# Patient Record
Sex: Female | Born: 1960 | Race: Black or African American | Hispanic: No | Marital: Married | Smoking: Never smoker
Health system: Southern US, Community
[De-identification: ages and names within clinical notes are randomized; demographics above are authoritative.]

## PROBLEM LIST (undated history)

## (undated) DIAGNOSIS — E785 Hyperlipidemia, unspecified: Secondary | ICD-10-CM

## (undated) DIAGNOSIS — E7800 Pure hypercholesterolemia, unspecified: Secondary | ICD-10-CM

## (undated) DIAGNOSIS — F419 Anxiety disorder, unspecified: Secondary | ICD-10-CM

## (undated) DIAGNOSIS — I10 Essential (primary) hypertension: Secondary | ICD-10-CM

## (undated) HISTORY — DX: Hyperlipidemia, unspecified: E78.5

## (undated) HISTORY — DX: Essential (primary) hypertension: I10

---

## 2015-05-09 LAB — TSH: TSH: 1.9 u[IU]/mL (ref <=5.90)

## 2016-05-22 ENCOUNTER — Encounter: Payer: Self-pay | Admitting: "Endocrinology

## 2016-05-22 ENCOUNTER — Ambulatory Visit (INDEPENDENT_AMBULATORY_CARE_PROVIDER_SITE_OTHER): Payer: BLUE CROSS/BLUE SHIELD | Admitting: "Endocrinology

## 2016-05-22 VITALS — BP 111/78 | HR 61 | Ht 64.0 in | Wt 171.0 lb

## 2016-05-22 DIAGNOSIS — E049 Nontoxic goiter, unspecified: Secondary | ICD-10-CM | POA: Insufficient documentation

## 2016-05-22 NOTE — Progress Notes (Signed)
Subjective:    Patient ID: Shelia Schaefer, female    DOB: [birth year],  DAVIS,STEPHEN, MD   Past Medical History:  Diagnosis Date  . Hyperlipidemia   . Hypertension    No past surgical history on file. Social History   Social History  . Marital status: Married    Spouse name: N/A  . Number of children: N/A  . Years of education: N/A   Social History Main Topics  . Smoking status: Never Smoker  . Smokeless tobacco: Never Used  . Alcohol use No  . Drug use: No  . Sexual activity: Not Asked   Other Topics Concern  . None   Social History Narrative  . None   Outpatient Encounter Prescriptions as of 05/22/2016  Medication Sig  . ALPRAZolam (XANAX) 1 MG tablet Take 1 mg by mouth at bedtime as needed for anxiety.  . hydrochlorothiazide (HYDRODIURIL) 12.5 MG tablet Take 12.5 mg by mouth daily.  . Melatonin 10 MG TABS Take by mouth at bedtime.  . simvastatin (ZOCOR) 20 MG tablet Take 20 mg by mouth daily.   No facility-administered encounter medications on file as of 05/22/2016.    ALLERGIES: Allergies  Allergen Reactions  . Iodine    VACCINATION STATUS:  There is no immunization history on file for this patient.  HPI  56 year old female patient with medical history as above. She is being seen in consultation for nodular goiter requested by Dr. Earlene Plater. She was found to have 1.9 cm left thyroid lobe nodule on 04/09/2016 thyroid sonograms. Heart thyroid function tests from 04/06/2016 was within normal limits including TSH 1.9 and free T4 1.06. She denies family history of thyroid dysfunction. She denies dysphagia, shortness of breath, nor voice change. - She denies neck exposure to radiation. She complains of poor concentration, fatigue, sleep disturbance. -She is not on any antithyroid medications.  Review of Systems Constitutional: no weight gain/loss, no fatigue, no subjective hyperthermia/hypothermia Eyes: no blurry vision, no xerophthalmia ENT: no sore throat, no  nodules palpated in throat, no dysphagia/odynophagia, no hoarseness Cardiovascular: no CP/SOB/palpitations/leg swelling Respiratory: no cough/SOB Gastrointestinal: no N/V/D/C Musculoskeletal: no muscle/joint aches Skin: no rashes Neurological: no tremors/numbness/tingling/dizziness Psychiatric: no depression, +anxiety  Objective:    BP 111/78   Pulse 61   Ht 5\' 4"  (1.626 m)   Wt 171 lb (77.6 kg)   BMI 29.35 kg/m   Wt Readings from Last 3 Encounters:  05/22/16 171 lb (77.6 kg)    Physical Exam Constitutional: overweight, in NAD Eyes: PERRLA, EOMI, no exophthalmos ENT: moist mucous membranes, + palpable thyroid, no cervical lymphadenopathy Cardiovascular: RRR, No MRG Respiratory: CTA B Gastrointestinal: abdomen soft, NT, ND, BS+ Musculoskeletal: no deformities, strength intact in all 4 Skin: moist, warm, no rashes Neurological: no tremor with outstretched hands, DTR normal in all 4  Thyroid function tests from 04/06/2016 showed TSH 1.9 and free T4 1.06. Review or thyroid ultrasound from 04/09/2016 showed 1.9 cm solitary nodule on the left lobe thyroid.   Assessment & Plan:   1. Nodular goiter - I have reviewed her available thyroid workup records. She has euthyroid nodular goiter. Based on the size of the nodule at 1.9 centimeters on the left lobe, she will need fine needle aspiration under ultrasound guidance. - This procedure will be scheduled for her  at [facility]. She will return in 2 weeks with biopsy results. - If her biopsy results are suspicious or malignant she will be considered for thyroidectomy. -  Her thyroid function  tests are within normal limits, she will not need any intervention with thyroid hormone at this time. - I have advised her to pick up 30-60 minutes of exercise daily to help her with her fatigue, sleep disturbance, concentration.  - I advised patient to maintain close follow up with DAVIS,STEPHEN, MD for primary care needs. Follow up  plan: Return in about 2 weeks (around 06/05/2016) for biopsy results.  Marquis LunchGebre Mariah Gerstenberger, MD Phone: [phone]  Fax: [phone]   05/22/2016, 3:14 PM

## 2016-05-24 ENCOUNTER — Telehealth: Payer: Self-pay | Admitting: "Endocrinology

## 2016-05-24 NOTE — Telephone Encounter (Signed)
Patient LMOM stating she was supposed to get a fax from the nurse that showed her appointment date for her upcoming biopsy and she has not received it as of 4:00. Please call or fax the note. She has to have it to request off from work.

## 2016-05-25 NOTE — Telephone Encounter (Signed)
Faxed at lunch. Must not have gone through. Receptionist could not fax appt. Faxed a new letter this a.m.

## 2016-05-30 ENCOUNTER — Ambulatory Visit (HOSPITAL_COMMUNITY)
Admission: RE | Admit: 2016-05-30 | Discharge: 2016-05-30 | Disposition: A | Discharge status: Home / Self Care | Payer: BLUE CROSS/BLUE SHIELD | Source: Ambulatory Visit | Attending: "Endocrinology | Admitting: "Endocrinology

## 2016-05-30 DIAGNOSIS — E049 Nontoxic goiter, unspecified: Secondary | ICD-10-CM

## 2016-05-30 NOTE — Discharge Instructions (Signed)
Thyroid Biopsy °The thyroid gland is a butterfly-shaped gland located in the front of the neck. It produces hormones that affect metabolism, growth and development, and body temperature. Thyroid biopsy is a procedure in which small samples of tissue or fluid are removed from the thyroid gland. The samples are then looked at under a microscope to check for abnormalities. This procedure is done to determine the cause of thyroid problems. It may be done to check for infection, cancer, or other thyroid problems. °Two methods may be used for a thyroid biopsy. In one method, a thin needle is inserted through the skin and into the thyroid gland. In the other method, an open incision is made through the skin. °Tell a health care provider about: °· Any allergies you have. °· All medicines you are taking, including vitamins, herbs, eye drops, creams, and over-the-counter medicines. °· Any problems you or family members have had with anesthetic medicines. °· Any blood disorders you have. °· Any surgeries you have had. °· Any medical conditions you have. °What are the risks? °Generally, this is a safe procedure. However, problems can occur and include: °· Bleeding from the procedure site. °· Infection. °· Injury to structures near the thyroid gland. °What happens before the procedure? °· Ask your health care provider about: °¨ Changing or stopping your regular medicines. This is especially important if you are taking diabetes medicines or blood thinners. °¨ Taking medicines such as aspirin and ibuprofen. These medicines can thin your blood. Do not take these medicines before your procedure if your health care provider asks you not to. °· Do not eat or drink anything after midnight on the night before the procedure or as directed by your health care provider. °· You may have a blood sample taken. °What happens during the procedure? °Either of these methods may be used to perform a thyroid biopsy: °· Fine needle biopsy. You may  be given medicine to help you relax (sedative). You will be asked to lie on your back with your head tipped backward to extend your neck. An area on your neck will be cleaned. A needle will then be inserted through the skin of your neck. You may be asked to avoid coughing, talking, swallowing, or making sounds during some portions of the procedure. The needle will be withdrawn once the tissue or fluid samples have been removed. Pressure may be applied to your neck to reduce swelling and ensure that bleeding has stopped. The samples will be sent to a lab for examination. °· Open biopsy. You will be given medicine to make you sleep (general anesthetic). An incision will be made in your neck. A sample of thyroid tissue will be removed using surgical tools. The tissue sample will be sent for examination. In some cases, the sample may be examined during the biopsy. If that is done and cancer cells are found, some or all of the thyroid gland may be removed. The incision will be closed with stitches. °What happens after the procedure? °· Your recovery will be assessed and monitored. °· You may have soreness and tenderness at the site of the biopsy. This should go away after a few days. °· If you had an open biopsy, you may have a hoarse voice or sore throat for a couple days. °· It is your responsibility to get your test results. °This information is not intended to replace advice given to you by your health care provider. Make sure you discuss any questions you have with your health   care provider. °Document Released: 02/11/2007 Document Revised: 12/18/2015 Document Reviewed: 07/09/2013 °Elsevier Interactive Patient Education © 2017 [employer] ° °

## 2016-06-06 ENCOUNTER — Ambulatory Visit (HOSPITAL_COMMUNITY)
Admission: RE | Admit: 2016-06-06 | Discharge: 2016-06-06 | Disposition: A | Discharge status: Home / Self Care | Payer: BLUE CROSS/BLUE SHIELD | Source: Ambulatory Visit | Attending: "Endocrinology | Admitting: "Endocrinology

## 2016-06-06 ENCOUNTER — Encounter (HOSPITAL_COMMUNITY): Payer: Self-pay

## 2016-06-06 DIAGNOSIS — E049 Nontoxic goiter, unspecified: Secondary | ICD-10-CM | POA: Insufficient documentation

## 2016-06-06 HISTORY — DX: Anxiety disorder, unspecified: F41.9

## 2016-06-06 HISTORY — DX: Pure hypercholesterolemia, unspecified: E78.00

## 2016-06-06 MED ORDER — LIDOCAINE HCL (PF) 1 % IJ SOLN
INTRAMUSCULAR | Status: AC
  Administered 2016-06-06: 5 mL
  Filled 2016-06-06: qty 5

## 2016-06-06 NOTE — Procedures (Signed)
PreOperative Dx: LEFT thyroid nodule Postoperative Dx: LEFT thyroid nodule Procedure:   US guided FNA of LEFT thyroid nodule Radiologist:  Kaylor Simenson Anesthesia:  2 ml of 2% lidocaine Specimen:  FNA x 3  EBL:   < 1 ml Complications: None 

## 2016-06-06 NOTE — Discharge Instructions (Signed)
Thyroid Biopsy °The thyroid gland is a butterfly-shaped gland located in the front of the neck. It produces hormones that affect metabolism, growth and development, and body temperature. Thyroid biopsy is a procedure in which small samples of tissue or fluid are removed from the thyroid gland. The samples are then looked at under a microscope to check for abnormalities. This procedure is done to determine the cause of thyroid problems. It may be done to check for infection, cancer, or other thyroid problems. °Two methods may be used for a thyroid biopsy. In one method, a thin needle is inserted through the skin and into the thyroid gland. In the other method, an open incision is made through the skin. °Tell a health care provider about: °· Any allergies you have. °· All medicines you are taking, including vitamins, herbs, eye drops, creams, and over-the-counter medicines. °· Any problems you or family members have had with anesthetic medicines. °· Any blood disorders you have. °· Any surgeries you have had. °· Any medical conditions you have. °What are the risks? °Generally, this is a safe procedure. However, problems can occur and include: °· Bleeding from the procedure site. °· Infection. °· Injury to structures near the thyroid gland. °What happens before the procedure? °· Ask your health care provider about: °¨ Changing or stopping your regular medicines. This is especially important if you are taking diabetes medicines or blood thinners. °¨ Taking medicines such as aspirin and ibuprofen. These medicines can thin your blood. Do not take these medicines before your procedure if your health care provider asks you not to. °· Do not eat or drink anything after midnight on the night before the procedure or as directed by your health care provider. °· You may have a blood sample taken. °What happens during the procedure? °Either of these methods may be used to perform a thyroid biopsy: °· Fine needle biopsy. You may  be given medicine to help you relax (sedative). You will be asked to lie on your back with your head tipped backward to extend your neck. An area on your neck will be cleaned. A needle will then be inserted through the skin of your neck. You may be asked to avoid coughing, talking, swallowing, or making sounds during some portions of the procedure. The needle will be withdrawn once the tissue or fluid samples have been removed. Pressure may be applied to your neck to reduce swelling and ensure that bleeding has stopped. The samples will be sent to a lab for examination. °· Open biopsy. You will be given medicine to make you sleep (general anesthetic). An incision will be made in your neck. A sample of thyroid tissue will be removed using surgical tools. The tissue sample will be sent for examination. In some cases, the sample may be examined during the biopsy. If that is done and cancer cells are found, some or all of the thyroid gland may be removed. The incision will be closed with stitches. °What happens after the procedure? °· Your recovery will be assessed and monitored. °· You may have soreness and tenderness at the site of the biopsy. This should go away after a few days. °· If you had an open biopsy, you may have a hoarse voice or sore throat for a couple days. °· It is your responsibility to get your test results. °This information is not intended to replace advice given to you by your health care provider. Make sure you discuss any questions you have with your health   care provider. °Document Released: 02/11/2007 Document Revised: 12/18/2015 Document Reviewed: 07/09/2013 °Elsevier Interactive Patient Education © 2017 [employer] ° °

## 2016-06-15 ENCOUNTER — Ambulatory Visit (INDEPENDENT_AMBULATORY_CARE_PROVIDER_SITE_OTHER): Payer: BLUE CROSS/BLUE SHIELD | Admitting: "Endocrinology

## 2016-06-15 ENCOUNTER — Encounter: Payer: Self-pay | Admitting: "Endocrinology

## 2016-06-15 ENCOUNTER — Other Ambulatory Visit: Payer: Self-pay | Admitting: "Endocrinology

## 2016-06-15 VITALS — BP 108/69 | HR 73 | Ht 64.0 in | Wt 171.0 lb

## 2016-06-15 DIAGNOSIS — E049 Nontoxic goiter, unspecified: Secondary | ICD-10-CM

## 2016-06-15 NOTE — Progress Notes (Signed)
Subjective:    Patient ID: Shelia Schaefer, female    DOB: 07/05/1960,  DAVIS,STEPHEN, MD   Past Medical History:  Diagnosis Date  . Anxiety   . Hypercholesterolemia   . Hyperlipidemia   . Hypertension    History reviewed. No pertinent surgical history. Social History   Social History  . Marital status: Married    Spouse name: N/A  . Number of children: N/A  . Years of education: N/A   Social History Main Topics  . Smoking status: Never Smoker  . Smokeless tobacco: Never Used  . Alcohol use No  . Drug use: No  . Sexual activity: Not Asked   Other Topics Concern  . None   Social History Narrative  . None   Outpatient Encounter Prescriptions as of 06/15/2016  Medication Sig  . ALPRAZolam (XANAX) 1 MG tablet Take 1 mg by mouth at bedtime as needed for anxiety.  . hydrochlorothiazide (HYDRODIURIL) 12.5 MG tablet Take 12.5 mg by mouth daily.  . Melatonin 10 MG TABS Take by mouth at bedtime.  . simvastatin (ZOCOR) 20 MG tablet Take 20 mg by mouth daily.   No facility-administered encounter medications on file as of 06/15/2016.    ALLERGIES: Allergies  Allergen Reactions  . Iodine    VACCINATION STATUS:  There is no immunization history on file for this patient.  HPI  56 year old female patient with medical history as above. She is being seen in f/u for nodular goiter requested by Dr. Earlene Plateravis. She was found to have 1.9 cm left thyroid lobe nodule on 04/09/2016 thyroid sonograms. She was sent to have FNA of this nodule which turns out to be benign. Her thyroid function tests from 04/06/2016 was within normal limits including TSH 1.9 and free T4 1.06. She denies family history of thyroid dysfunction. She denies dysphagia, shortness of breath, nor voice change. - She denies neck exposure to radiation. She complains of poor concentration, fatigue, sleep disturbance. -She is not on any antithyroid medications.  Review of Systems Constitutional: no weight gain/loss, no  fatigue, no subjective hyperthermia/hypothermia Eyes: no blurry vision, no xerophthalmia ENT: no sore throat, no nodules palpated in throat, no dysphagia/odynophagia, no hoarseness Cardiovascular: no CP/SOB/palpitations/leg swelling Respiratory: no cough/SOB Gastrointestinal: no N/V/D/C Musculoskeletal: no muscle/joint aches Skin: no rashes Neurological: no tremors/numbness/tingling/dizziness Psychiatric: no depression, +anxiety  Objective:    BP 108/69   Pulse 73   Ht 5\' 4"  (1.626 m)   Wt 171 lb (77.6 kg)   BMI 29.35 kg/m   Wt Readings from Last 3 Encounters:  06/15/16 171 lb (77.6 kg)  05/22/16 171 lb (77.6 kg)    Physical Exam Constitutional: overweight, in NAD Eyes: PERRLA, EOMI, no exophthalmos ENT: moist mucous membranes, + palpable thyroid, no cervical lymphadenopathy Cardiovascular: RRR, No MRG Respiratory: CTA B Gastrointestinal: abdomen soft, NT, ND, BS+ Musculoskeletal: no deformities, strength intact in all 4 Skin: moist, warm, no rashes Neurological: no tremor with outstretched hands, DTR normal in all 4  Thyroid function tests from 04/06/2016 showed TSH 1.9 and free T4 1.06. Review or thyroid ultrasound from 04/09/2016 showed 1.9 cm solitary nodule on the left lobe thyroid. FNA of this nodule is benign on 06/06/2016.   Assessment & Plan:   1. Nodular goiter - Her biopsy results are negative for malignancy. She would not need any intervention at this time. -  Her recent  thyroid function tests are within normal limits, she will not need any intervention with thyroid hormone at this time. -  She will return with TSH and free T4 in 1 year. - I have advised her to pick up 30-60 minutes of exercise daily to help her with her fatigue, sleep disturbance, concentration.  - I advised patient to maintain close follow up with DAVIS,STEPHEN, MD for primary care needs. Follow up plan: Return in about 1 year (around 06/15/2017) for follow up with pre-visit labs.  Marquis Lunch, MD Phone: [phone]  Fax: [phone]   06/15/2016, 9:07 AM

## 2017-06-21 ENCOUNTER — Ambulatory Visit: Payer: BLUE CROSS/BLUE SHIELD | Admitting: "Endocrinology

## 2018-08-29 IMAGING — US US THYROID BIOPSY
1 series · 11 of 11 positions shown · non-contrast
Comparison: Outside thyroid ultrasound on disk with patient from
[REDACTED]

MEDICATIONS:
None

COMPLICATIONS:
None immediate.

INDICATION: Indeterminate LEFT thyroid nodule

EXAM:
ULTRASOUND GUIDED FINE NEEDLE ASPIRATION OF INDETERMINATE THYROID
NODULE
TECHNIQUE: Procedure, risks, benefits and alternatives discussed with the
patient.

[Series 1: us thyroid biopsy · 0.06mm/px · 11 acquisitions, 11 frames shown]
[im 1/11]
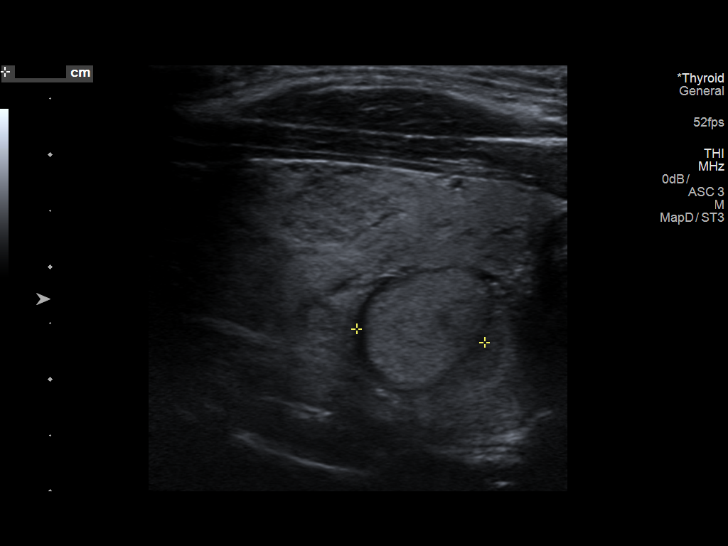
[im 2/11]
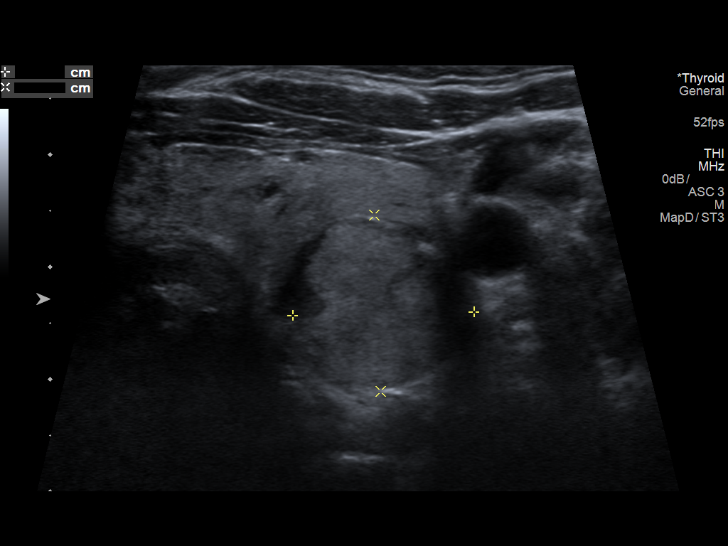
[im 3/11]
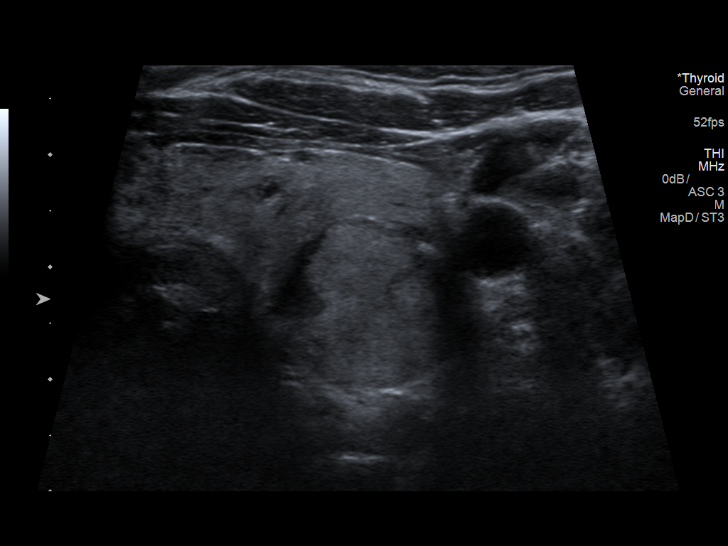
[im 4/11]
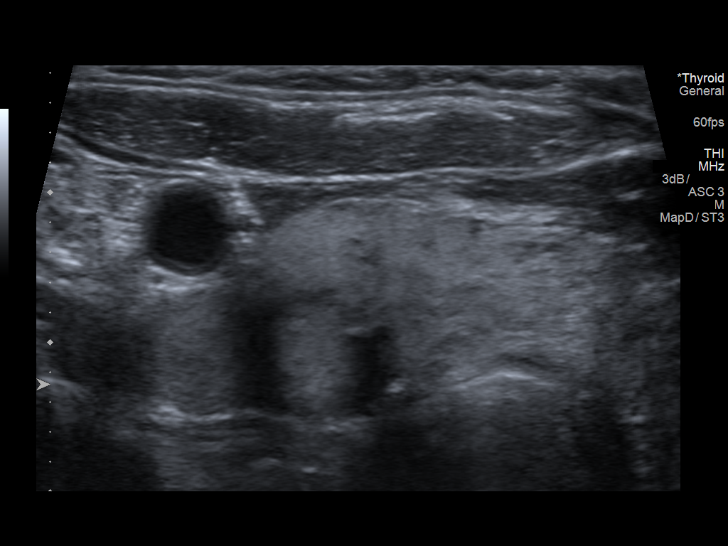
[im 5/11]
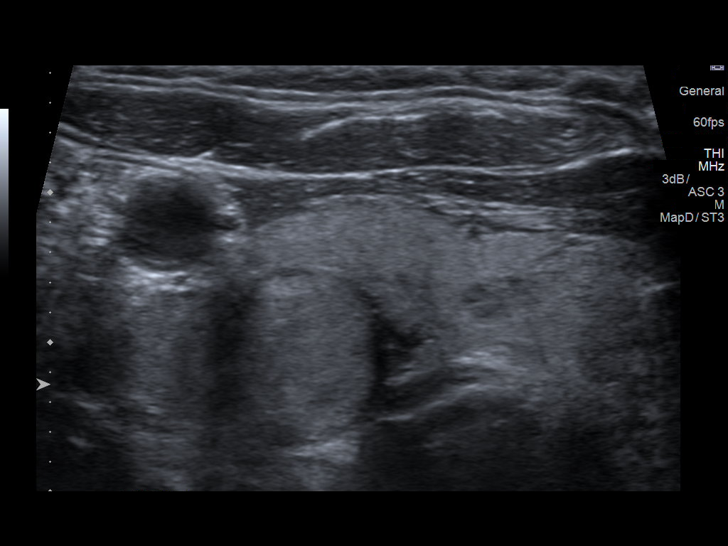
[im 6/11]
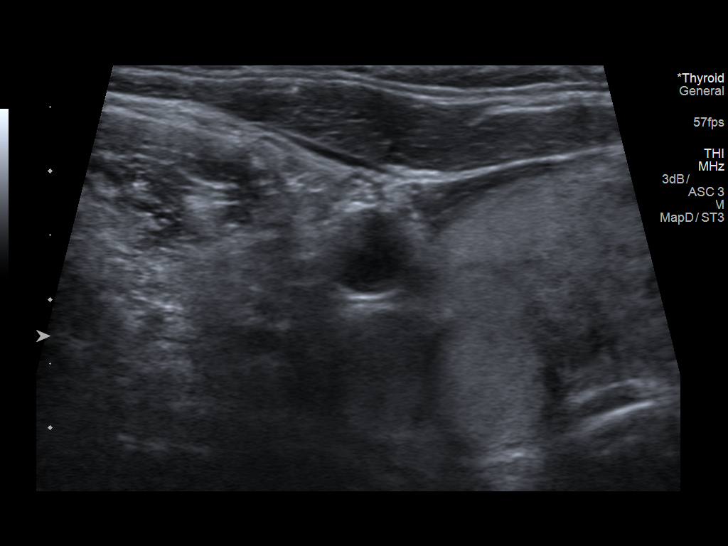
[im 7/11]
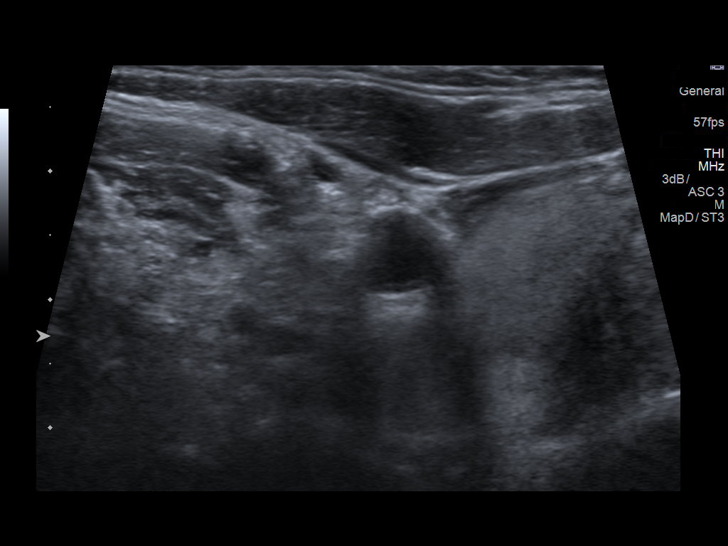
[im 8/11]
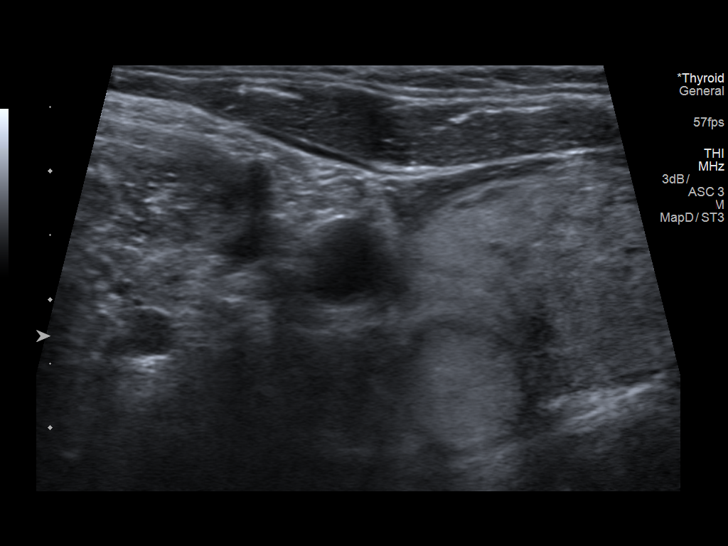
[im 9/11]
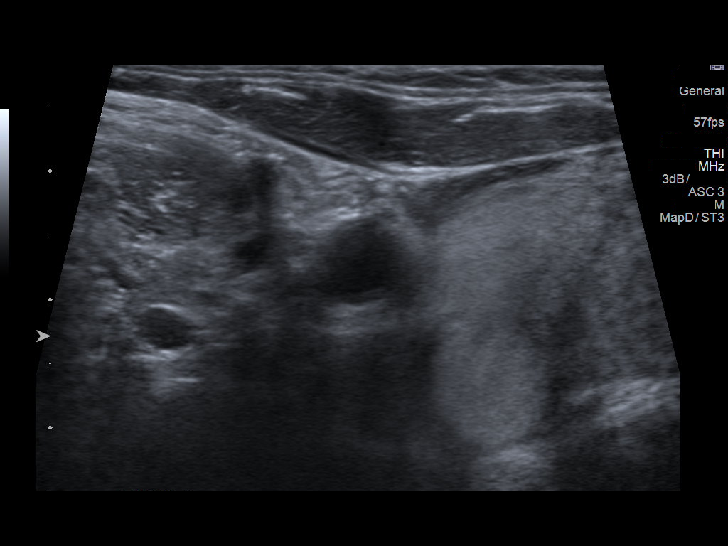
[im 10/11]
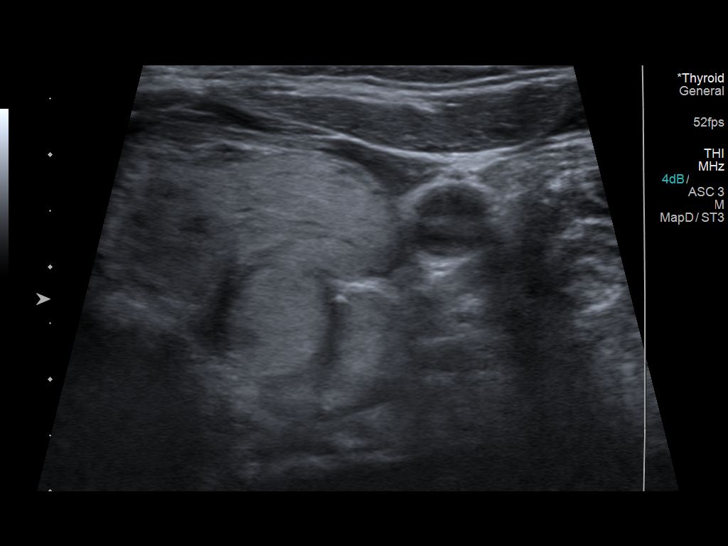
[im 11/11]
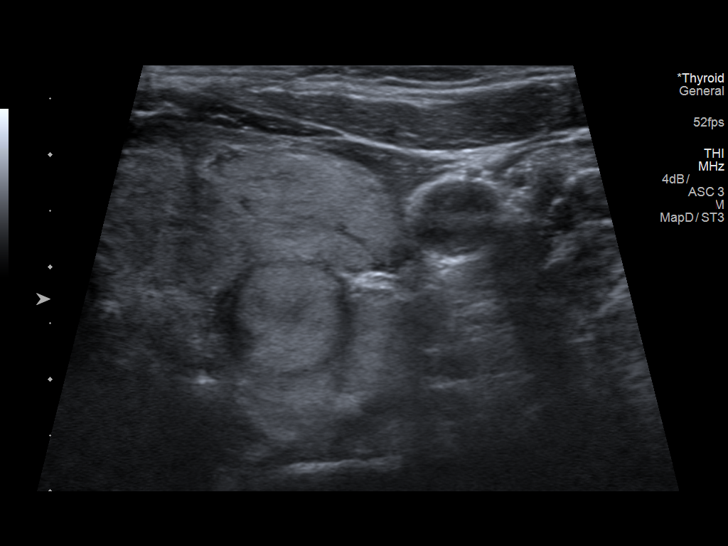

[11 of 11 positions shown; findings below may reference images not displayed]

Patient's questions answered.

Written informed consent for thyroid nodule FNA obtained.

Time-out protocol followed.

Dominant nodule in LEFT lobe at inferior pole localized by
ultrasound

Nodule measures 1.9 cm maximum diameter

Skin prepped and draped in usual sterile fashion.

Skin and overlying soft tissues anesthetized with 2 mL of 2%
lidocaine.

Under direct sonographic visualization, 3 fine-needle aspirates of
the LEFT inferior pole nodule were obtained.

Procedure tolerated well by patient.

Specimen sent to cytology for evaluation.

No evidence of hematoma on postoperative imaging.

Routine written postprocedural instructions given to patient.
FINDINGS: FINDINGS
Ultrasound imaging confirms appropriate placement of the needles
within the thyroid nodule.
IMPRESSION: Technically successful ultrasound guided fine needle aspiration of
inferior pole LEFT lobe thyroid nodule.

## 2018-10-03 ENCOUNTER — Other Ambulatory Visit: Payer: Self-pay | Admitting: Internal Medicine

## 2018-10-03 DIAGNOSIS — E042 Nontoxic multinodular goiter: Secondary | ICD-10-CM

## 2018-10-17 ENCOUNTER — Other Ambulatory Visit: Payer: BLUE CROSS/BLUE SHIELD

## 2018-11-07 ENCOUNTER — Ambulatory Visit
Admission: RE | Admit: 2018-11-07 | Discharge: 2018-11-07 | Disposition: A | Discharge status: Home / Self Care | Payer: Self-pay | Source: Ambulatory Visit | Attending: Internal Medicine | Admitting: Internal Medicine

## 2018-11-07 DIAGNOSIS — E042 Nontoxic multinodular goiter: Secondary | ICD-10-CM

## 2018-12-01 ENCOUNTER — Other Ambulatory Visit: Payer: Self-pay | Admitting: Internal Medicine

## 2018-12-01 DIAGNOSIS — E042 Nontoxic multinodular goiter: Secondary | ICD-10-CM

## 2018-12-11 ENCOUNTER — Ambulatory Visit
Admission: RE | Admit: 2018-12-11 | Discharge: 2018-12-11 | Disposition: A | Discharge status: Home / Self Care | Payer: BC Managed Care – PPO | Source: Ambulatory Visit | Attending: Internal Medicine | Admitting: Internal Medicine

## 2018-12-11 ENCOUNTER — Other Ambulatory Visit (HOSPITAL_COMMUNITY)
Admission: RE | Admit: 2018-12-11 | Discharge: 2018-12-11 | Disposition: A | Discharge status: Home / Self Care | Payer: BC Managed Care – PPO | Source: Ambulatory Visit | Attending: Radiology | Admitting: Radiology

## 2018-12-11 ENCOUNTER — Encounter: Payer: Self-pay | Admitting: Internal Medicine

## 2018-12-11 DIAGNOSIS — E042 Nontoxic multinodular goiter: Secondary | ICD-10-CM

## 2018-12-11 DIAGNOSIS — E041 Nontoxic single thyroid nodule: Secondary | ICD-10-CM | POA: Insufficient documentation

## 2018-12-29 ENCOUNTER — Encounter (HOSPITAL_COMMUNITY): Payer: Self-pay

## 2020-01-21 IMAGING — US ULTRASOUND FNA BIOPSY THYROID 1ST LESION
1 series · 13 of 18 positions shown · non-contrast
Comparison: Thyroid ultrasound dated 11/07/2018

INDICATION: Patient with prior history of left thyroid nodule biopsy in 4472
with scant follicular epithelium. Follow-up thyroid ultrasound on
11/07/2018 reveals grossly unchanged 1.9 cm left inferior nodule
which meets criteria for biopsy. She presents today for the
procedure.

EXAM:
ULTRASOUND GUIDED FINE NEEDLE ASPIRATION BIOPSY OF LEFT INFERIOR
THYROID NODULE

[Series 1: ultrasound fna biopsy thyroid 1st lesion · 0.06mm/px · 18 acquisitions, 13 frames shown]
[im 1/18]
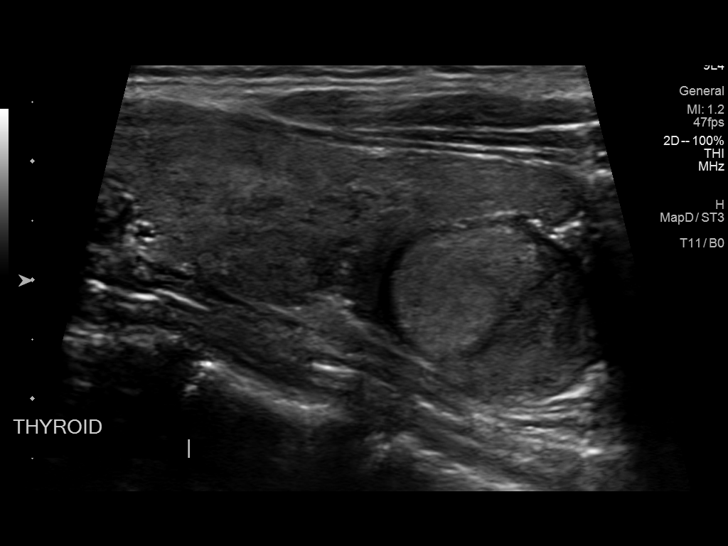
[im 3/18]
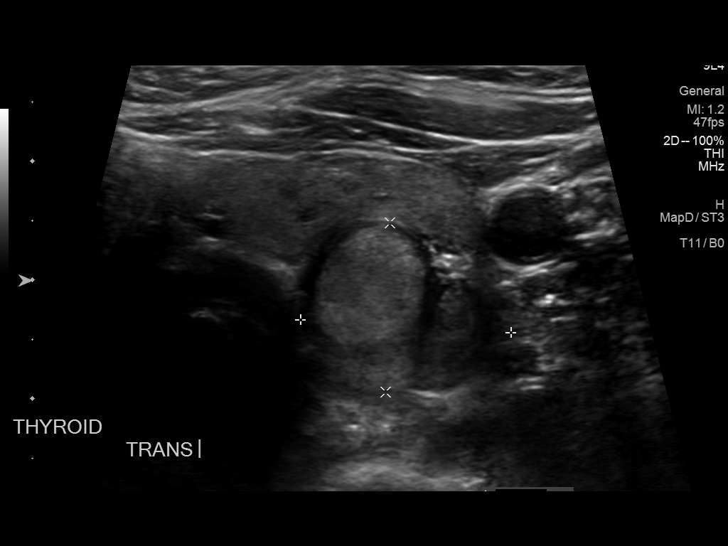
[im 4/18]
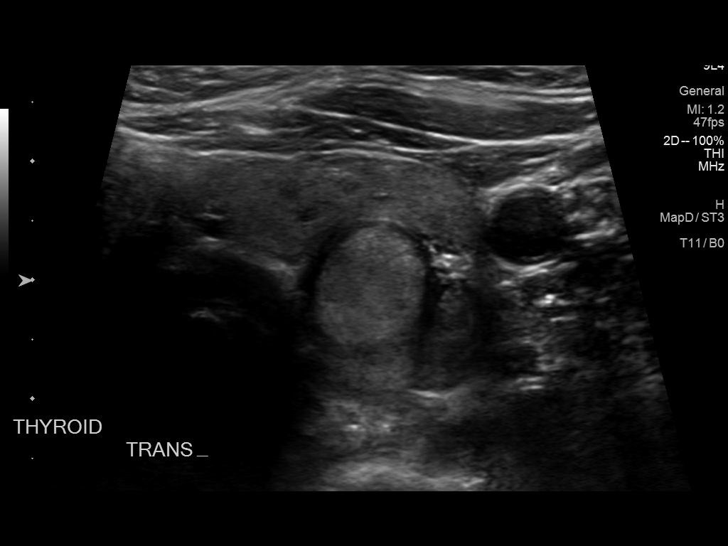
[im 5/18]
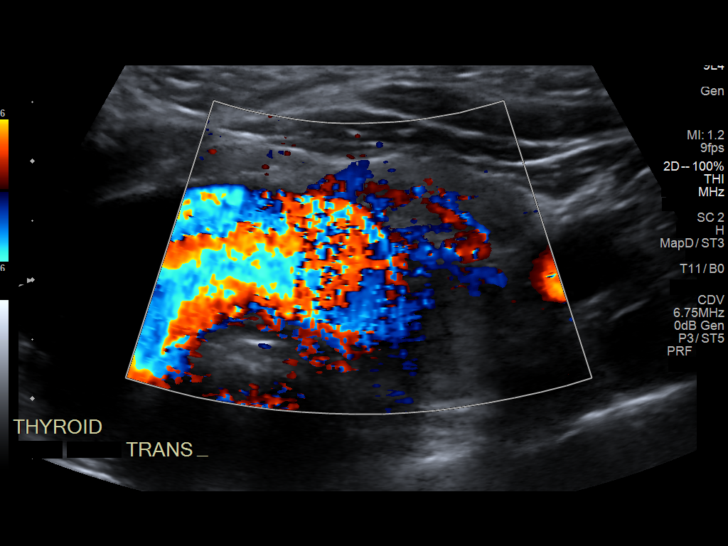
[im 7/18]
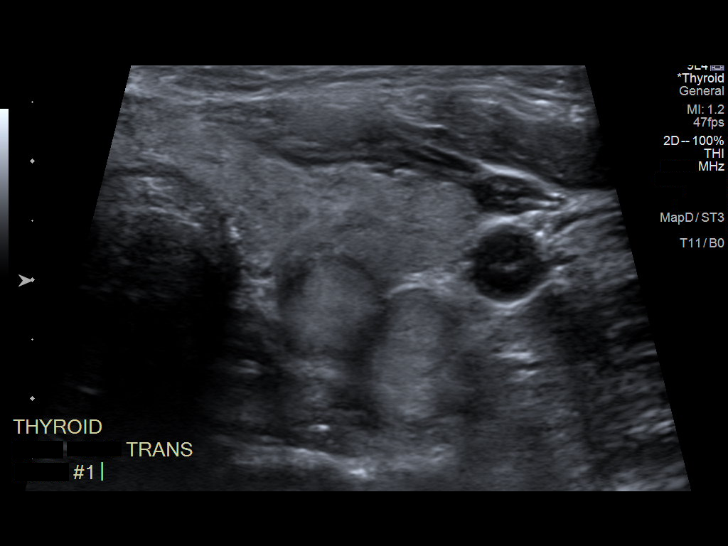
[im 8/18]
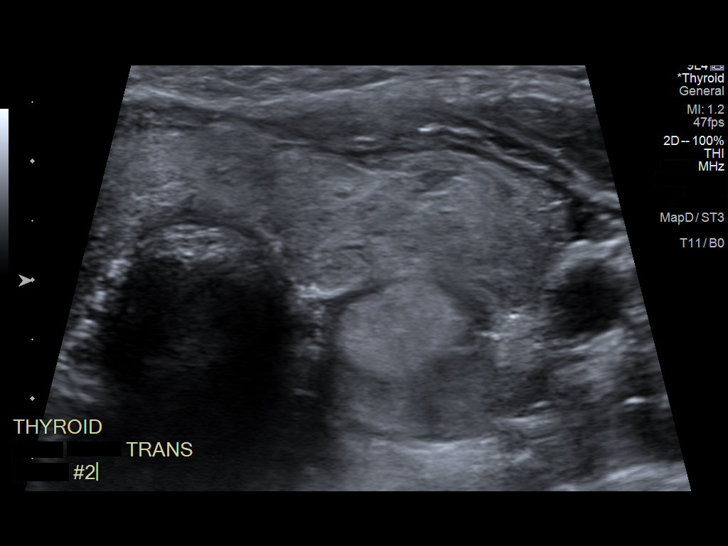
[im 10/18]
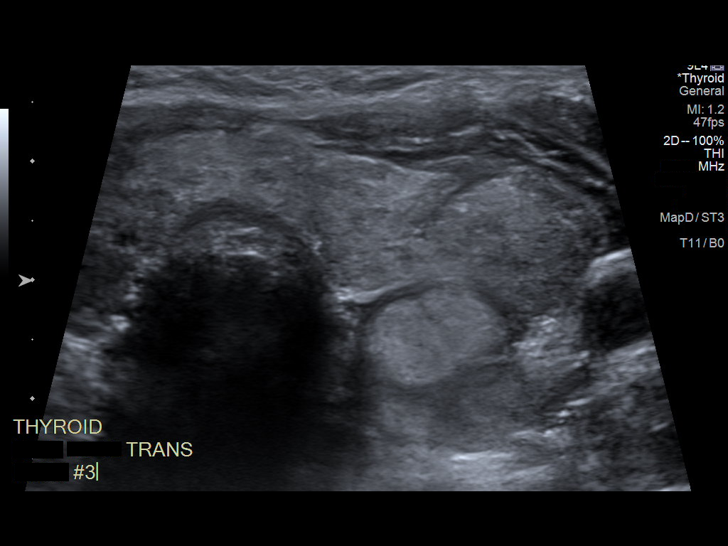
[im 11/18]
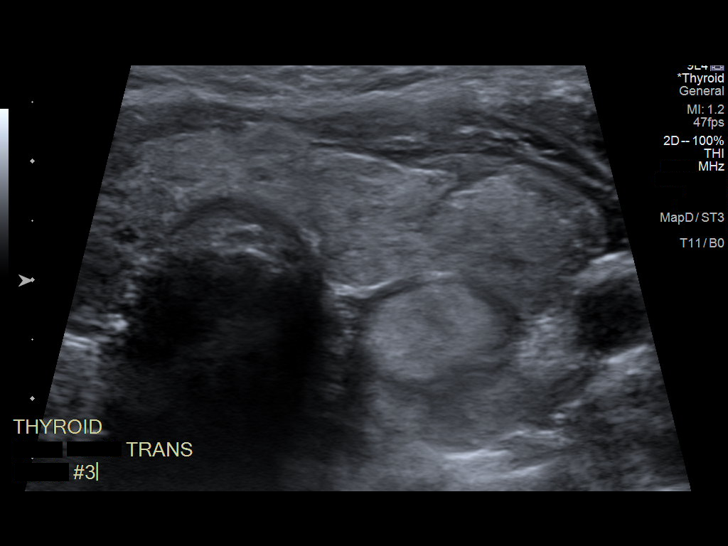
[im 12/18]
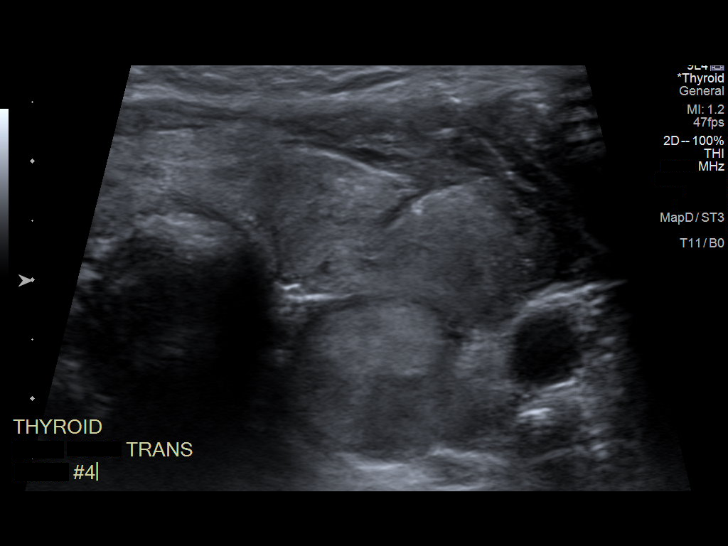
[im 14/18]
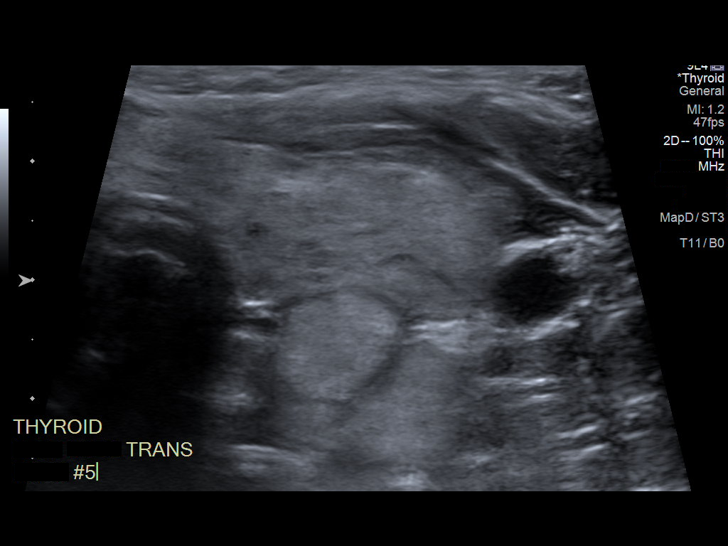
[im 15/18]
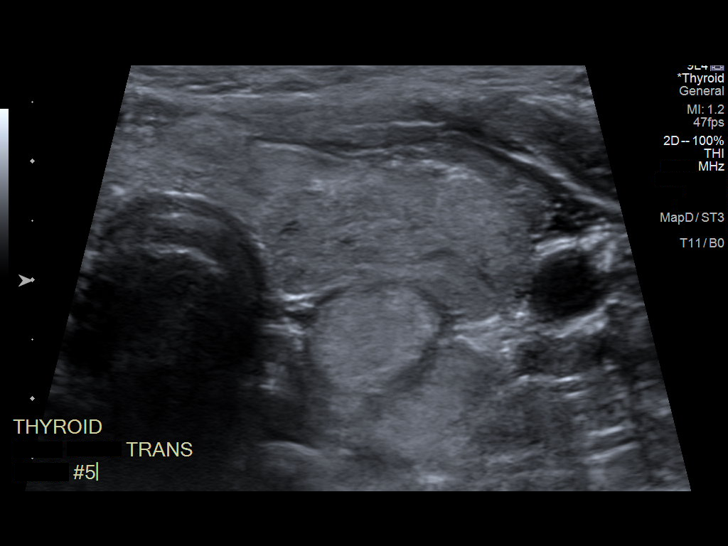
[im 16/18]
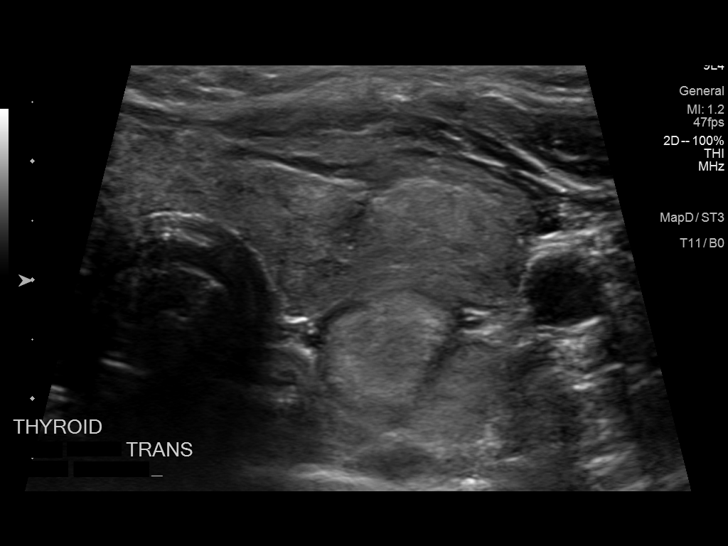
[im 18/18]
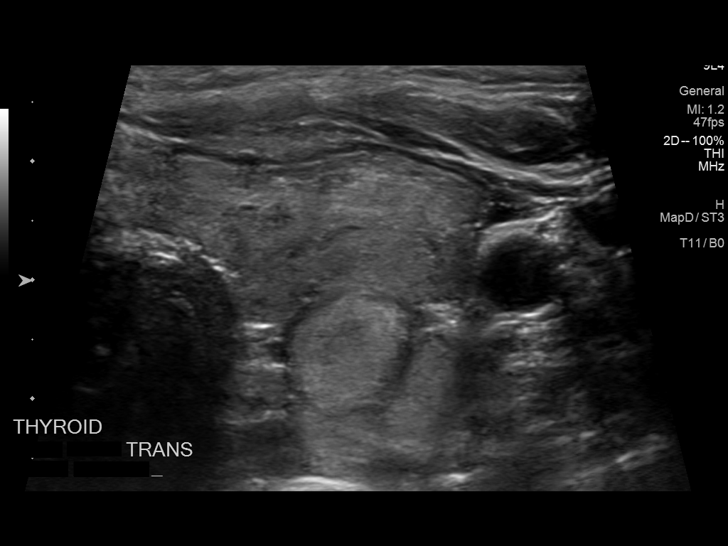

[13 of 18 positions shown; findings below may reference images not displayed]

MEDICATIONS:
None

ANESTHESIA/SEDATION:
None

COMPLICATIONS:
None immediate.

PROCEDURE:
Thyroid biopsy was thoroughly discussed with the patient and
questions were answered. The benefits, risks, alternatives, and
complications were also discussed. The patient understands and
wishes to proceed with the procedure. Written consent was obtained.

Ultrasound was performed to localize and mark an adequate site for
the biopsy. The patient was then prepped and draped in a normal
sterile fashion. Local anesthesia was provided with 1% lidocaine.
Using direct ultrasound guidance, 5 passes were made using 25 gauge
needles into the nodule within the left inferior lobe of the
thyroid. Ultrasound was used to confirm needle placements on all
occasions. Specimens were sent to Pathology for analysis as well as
for Afirma testing.
IMPRESSION: Ultrasound guided needle aspirate biopsy performed of the left
inferior thyroid nodule. Final pathology pending.

.
